# Patient Record
Sex: Male | Born: 1985 | Race: Black or African American | Hispanic: No | Marital: Single | Smoking: Current every day smoker
Health system: Southern US, Community
[De-identification: ages and names within clinical notes are randomized; demographics above are authoritative.]

---

## 2013-06-29 ENCOUNTER — Emergency Department (HOSPITAL_COMMUNITY)
Admission: EM | Admit: 2013-06-29 | Discharge: 2013-06-29 | Disposition: A | Discharge status: Home / Self Care | Payer: Self-pay | Attending: Emergency Medicine | Admitting: Emergency Medicine

## 2013-06-29 ENCOUNTER — Emergency Department (HOSPITAL_COMMUNITY): Payer: Self-pay

## 2013-06-29 ENCOUNTER — Encounter (HOSPITAL_COMMUNITY): Payer: Self-pay | Admitting: Emergency Medicine

## 2013-06-29 DIAGNOSIS — F17200 Nicotine dependence, unspecified, uncomplicated: Secondary | ICD-10-CM | POA: Insufficient documentation

## 2013-06-29 DIAGNOSIS — S0003XA Contusion of scalp, initial encounter: Secondary | ICD-10-CM | POA: Insufficient documentation

## 2013-06-29 DIAGNOSIS — Y929 Unspecified place or not applicable: Secondary | ICD-10-CM | POA: Insufficient documentation

## 2013-06-29 DIAGNOSIS — S0083XA Contusion of other part of head, initial encounter: Principal | ICD-10-CM

## 2013-06-29 DIAGNOSIS — W1789XA Other fall from one level to another, initial encounter: Secondary | ICD-10-CM | POA: Insufficient documentation

## 2013-06-29 DIAGNOSIS — Z23 Encounter for immunization: Secondary | ICD-10-CM | POA: Insufficient documentation

## 2013-06-29 DIAGNOSIS — S1093XA Contusion of unspecified part of neck, initial encounter: Principal | ICD-10-CM

## 2013-06-29 DIAGNOSIS — Y939 Activity, unspecified: Secondary | ICD-10-CM | POA: Insufficient documentation

## 2013-06-29 DIAGNOSIS — S0001XA Abrasion of scalp, initial encounter: Secondary | ICD-10-CM

## 2013-06-29 MED ORDER — TETANUS-DIPHTH-ACELL PERTUSSIS 5-2.5-18.5 LF-MCG/0.5 IM SUSP
0.5000 mL | Freq: Once | INTRAMUSCULAR | Status: AC
  Administered 2013-06-29: 0.5 mL via INTRAMUSCULAR
  Filled 2013-06-29: qty 0.5

## 2013-06-29 MED ORDER — OXYCODONE-ACETAMINOPHEN 5-325 MG PO TABS
1.0000 | ORAL_TABLET | ORAL | Status: AC | PRN
Start: 2013-06-29 — End: ?

## 2013-06-29 MED ORDER — OXYCODONE-ACETAMINOPHEN 5-325 MG PO TABS
1.0000 | ORAL_TABLET | Freq: Once | ORAL | Status: AC
  Administered 2013-06-29: 1 via ORAL
  Filled 2013-06-29: qty 1

## 2013-06-29 NOTE — Discharge Instructions (Signed)
Read the information below.  Use the prescribed medication as directed.  Please discuss all new medications with your pharmacist.  Do not take additional tylenol while taking the prescribed pain medication to avoid overdose.  You may return to the Emergency Department at any time for worsening condition or any new symptoms that concern you.    You have had a head injury which does not appear to require admission at this time. A concussion is a state of changed mental ability from trauma. SEEK IMMEDIATE MEDICAL ATTENTION IF: There is confusion or drowsiness (although children frequently become drowsy after injury).  You cannot awaken the injured person.  There is nausea (feeling sick to your stomach) or continued, forceful vomiting.  You notice dizziness or unsteadiness which is getting worse, or inability to walk.  You have convulsions or unconsciousness.  You experience severe, persistent headaches not relieved by Tylenol?. (Do not take aspirin as this impairs clotting abilities). Take other pain medications only as directed.  You cannot use arms or legs normally.  There are changes in pupil sizes. (This is the black center in the colored part of the eye)  There is clear or bloody discharge from the nose or ears.  Change in speech, vision, swallowing, or understanding.  Localized weakness, numbness, tingling, or change in bowel or bladder control.    Head Injury, Adult You have received a head injury. It does not appear serious at this time. Headaches and vomiting are common following head injury. It should be easy to awaken from sleeping. Sometimes it is necessary for you to stay in the emergency department for a while for observation. Sometimes admission to the hospital may be needed. After injuries such as yours, most problems occur within the first 24 hours, but side effects may occur up to 7 10 days after the injury. It is important for you to carefully monitor your condition and contact  your health care provider or seek immediate medical care if there is a change in your condition. WHAT ARE THE TYPES OF HEAD INJURIES? Head injuries can be as minor as a bump. Some head injuries can be more severe. More severe head injuries include:  A jarring injury to the brain (concussion).  A bruise of the brain (contusion). This mean there is bleeding in the brain that can cause swelling.  A cracked skull (skull fracture).  Bleeding in the brain that collects, clots, and forms a bump (hematoma). WHAT CAUSES A HEAD INJURY? A serious head injury is most likely to happen to someone who is in a car wreck and is not wearing a seat belt. Other causes of major head injuries include bicycle or motorcycle accidents, sports injuries, and falls. HOW ARE HEAD INJURIES DIAGNOSED? A complete history of the event leading to the injury and your current symptoms will be helpful in diagnosing head injuries. Many times, pictures of the brain, such as CT or MRI are needed to see the extent of the injury. Often, an overnight hospital stay is necessary for observation.  WHEN SHOULD I SEEK IMMEDIATE MEDICAL CARE?  You should get help right away if:  You have confusion or drowsiness.  You feel sick to your stomach (nauseous) or have continued, forceful vomiting.  You have dizziness or unsteadiness that is getting worse.  You have severe, continued headaches not relieved by medicine. Only take over-the-counter or prescription medicines for pain, fever, or discomfort as directed by your health care provider.  You do not have normal function of the  arms or legs or are unable to walk.  You notice changes in the black spots in the center of the colored part of your eye (pupil).  You have a clear or bloody fluid coming from your nose or ears.  You have a loss of vision. During the next 24 hours after the injury, you must stay with someone who can watch you for the warning signs. This person should contact  local emergency services (911 in the [place] if you have seizures, you become unconscious, or you are unable to wake up. HOW CAN I PREVENT A HEAD INJURY IN THE FUTURE? The most important factor for preventing major head injuries is avoiding motor vehicle accidents. To minimize the potential for damage to your head, it is crucial to wear seat belts while riding in motor vehicles. Wearing helmets while bike riding and playing collision sports (like football) is also helpful. Also, avoiding dangerous activities around the house will further help reduce your risk of head injury.  WHEN CAN I RETURN TO NORMAL ACTIVITIES AND ATHLETICS? You should be reevaluated by your health care provider before returning to these activities. If you have any of the following symptoms, you should not return to activities or contact sports until 1 week after the symptoms have stopped:  Persistent headache.  Dizziness or vertigo.  Poor attention and concentration.  Confusion.  Memory problems.  Nausea or vomiting.  Fatigue or tire easily.  Irritability.  Intolerant of bright lights or loud noises.  Anxiety or depression.  Disturbed sleep. MAKE SURE YOU:   Understand these instructions.  Will watch your condition.  Will get help right away if you are not doing well or get worse. Document Released: 03/27/2005 Document Revised: 01/15/2013 Document Reviewed: 12/02/2012 [facility] Patient Information 2014 [place], [place].  Facial or Scalp Contusion A facial or scalp contusion is a deep bruise on the face or head. Injuries to the face and head generally cause a lot of swelling, especially around the eyes. Contusions are the result of an injury that caused bleeding under the skin. The contusion may turn blue, purple, or yellow. Minor injuries will give you a painless contusion, but more severe contusions may stay painful and swollen for a few weeks.  CAUSES  A facial or scalp contusion is caused by a blunt  injury or trauma to the face or head area.  SIGNS AND SYMPTOMS   Swelling of the injured area.   Discoloration of the injured area.   Tenderness, soreness, or pain in the injured area.  DIAGNOSIS  The diagnosis can be made by taking a medical history and doing a physical exam. An X-ray exam, CT scan, or MRI may be needed to determine if there are any associated injuries, such as broken bones (fractures). TREATMENT  Often, the best treatment for a facial or scalp contusion is applying cold compresses to the injured area. Over-the-counter medicines may also be recommended for pain control.  HOME CARE INSTRUCTIONS   Only take over-the-counter or prescription medicines as directed by your health care provider.   Apply ice to the injured area.   Put ice in a plastic bag.   Place a towel between your skin and the bag.   Leave the ice on for 20 minutes, 2 3 times a day.  SEEK MEDICAL CARE IF:  You have bite problems.   You have pain with chewing.   You are concerned about facial defects. SEEK IMMEDIATE MEDICAL CARE IF:  You have severe pain or a  headache that is not relieved by medicine.   You have unusual sleepiness, confusion, or personality changes.   You throw up (vomit).   You have a persistent nosebleed.   You have double vision or blurred vision.   You have fluid drainage from your nose or ear.   You have difficulty walking or using your arms or legs.  MAKE SURE YOU:   Understand these instructions.  Will watch your condition.  Will get help right away if you are not doing well or get worse. Document Released: 05/04/2004 Document Revised: 01/15/2013 Document Reviewed: 11/07/2012 [facility] Patient Information 2014 [place].  Hematoma A hematoma is a collection of blood under the skin, in an organ, in a body space, in a joint space, or in other tissue. The blood can clot to form a lump that you can see and feel. The lump is often firm and  may sometimes become sore and tender. Most hematomas get better in a few days to weeks. However, some hematomas may be serious and require medical care. Hematomas can range in size from very small to very large. CAUSES  A hematoma can be caused by a blunt or penetrating injury. It can also be caused by spontaneous leakage from a blood vessel under the skin. Spontaneous leakage from a blood vessel is more likely to occur in older people, especially those taking blood thinners. Sometimes, a hematoma can develop after certain medical procedures. SIGNS AND SYMPTOMS   A firm lump on the body.  Possible pain and tenderness in the area.  Bruising.Blue, dark blue, purple-red, or yellowish skin may appear at the site of the hematoma if the hematoma is close to the surface of the skin. For hematomas in deeper tissues or body spaces, the signs and symptoms may be subtle. For example, an intra-abdominal hematoma may cause abdominal pain, weakness, fainting, and shortness of breath. An intracranial hematoma may cause a headache or symptoms such as weakness, trouble speaking, or a change in consciousness. DIAGNOSIS  A hematoma can usually be diagnosed based on your medical history and a physical exam. Imaging tests may be needed if your health care provider suspects a hematoma in deeper tissues or body spaces, such as the abdomen, head, or chest. These tests may include ultrasonography or a CT scan.  TREATMENT  Hematomas usually go away on their own over time. Rarely does the blood need to be drained out of the body. Large hematomas or those that may affect vital organs will sometimes need surgical drainage or monitoring. HOME CARE INSTRUCTIONS   Apply ice to the injured area:   Put ice in a plastic bag.   Place a towel between your skin and the bag.   Leave the ice on for 20 minutes, 2 3 times a day for the first 1 to 2 days.   After the first 2 days, switch to using warm compresses on the hematoma.    Elevate the injured area to help decrease pain and swelling. Wrapping the area with an elastic bandage may also be helpful. Compression helps to reduce swelling and promotes shrinking of the hematoma. Make sure the bandage is not wrapped too tight.   If your hematoma is on a lower extremity and is painful, crutches may be helpful for a couple days.   Only take over-the-counter or prescription medicines as directed by your health care provider. SEEK IMMEDIATE MEDICAL CARE IF:   You have increasing pain, or your pain is not controlled with medicine.  You have a fever.   You have worsening swelling or discoloration.   Your skin over the hematoma breaks or starts bleeding.   Your hematoma is in your chest or abdomen and you have weakness, shortness of breath, or a change in consciousness.  Your hematoma is on your scalp (caused by a fall or injury) and you have a worsening headache or a change in alertness or consciousness. MAKE SURE YOU:   Understand these instructions.  Will watch your condition.  Will get help right away if you are not doing well or get worse. Document Released: 11/09/2003 Document Revised: 11/27/2012 Document Reviewed: 09/04/2012 [facility] Patient Information 2014 [place], [place].

## 2013-06-29 NOTE — ED Provider Notes (Signed)
Medical screening examination/treatment/procedure(s) were conducted as a shared visit with non-physician practitioner(s) and myself.  I personally evaluated the patient during the encounter.   EKG Interpretation None      Well appearing. Dc home. Plain films and CT negative.   1. Injury resulting from fall from height   2. Scalp hematoma   3. Scalp abrasion    Dg Chest 2 View  06/29/2013   CLINICAL DATA:  Fall from 15 feet.  No chest complaints.  EXAM: CHEST  2 VIEW  COMPARISON:  None.  FINDINGS: The heart size and mediastinal contours are within normal limits. Both lungs are clear. The visualized skeletal structures are unremarkable.  IMPRESSION: No active cardiopulmonary disease.   Electronically Signed   By: Elige KoHetal  Patel   On: 06/29/2013 15:42   Dg Pelvis 1-2 Views  06/29/2013   CLINICAL DATA:  Fall  EXAM: PELVIS - 1-2 VIEW  COMPARISON:  None.  FINDINGS: There is no evidence of pelvic fracture or diastasis. No other pelvic bone lesions are seen.  IMPRESSION: Negative.   Electronically Signed   By: Elige KoHetal  Patel   On: 06/29/2013 15:42   Ct Head Wo Contrast  06/29/2013   CLINICAL DATA:  Injury  EXAM: CT HEAD WITHOUT CONTRAST  CT CERVICAL SPINE WITHOUT CONTRAST  TECHNIQUE: Multidetector CT imaging of the head and cervical spine was performed following the standard protocol without intravenous contrast. Multiplanar CT image reconstructions of the cervical spine were also generated.  COMPARISON:  None.  FINDINGS: CT HEAD FINDINGS  No mass effect, midline shift, or acute intracranial hemorrhage. Brain parenchyma and ventricular system are grossly within normal limits. Soft tissue hemorrhage over the right parietal region is noted. Mastoid air cells are clear. Cranium is intact.  CT CERVICAL SPINE FINDINGS  No acute fracture. No dislocation. No obvious soft tissue injury. No spinal hematoma.  8 mm hypodensity in the right thyroid gland. Lung apices are unremarkable.  IMPRESSION: No acute intracranial  pathology.  No acute injury in the cervical spine.  8 mm right thyroid hypodensity. A outpatient ultrasound is recommended.   Electronically Signed   By: Maryclare BeanArt  Hoss M.D.   On: 06/29/2013 13:10   Ct Cervical Spine Wo Contrast  06/29/2013   CLINICAL DATA:  Injury  EXAM: CT HEAD WITHOUT CONTRAST  CT CERVICAL SPINE WITHOUT CONTRAST  TECHNIQUE: Multidetector CT imaging of the head and cervical spine was performed following the standard protocol without intravenous contrast. Multiplanar CT image reconstructions of the cervical spine were also generated.  COMPARISON:  None.  FINDINGS: CT HEAD FINDINGS  No mass effect, midline shift, or acute intracranial hemorrhage. Brain parenchyma and ventricular system are grossly within normal limits. Soft tissue hemorrhage over the right parietal region is noted. Mastoid air cells are clear. Cranium is intact.  CT CERVICAL SPINE FINDINGS  No acute fracture. No dislocation. No obvious soft tissue injury. No spinal hematoma.  8 mm hypodensity in the right thyroid gland. Lung apices are unremarkable.  IMPRESSION: No acute intracranial pathology.  No acute injury in the cervical spine.  8 mm right thyroid hypodensity. A outpatient ultrasound is recommended.   Electronically Signed   By: Maryclare BeanArt  Hoss M.D.   On: 06/29/2013 13:10  I personally reviewed the imaging tests through PACS system I reviewed available ER/hospitalization records through the EMR  Pt infomed of need for thyroid US as outpatient  Lyanne CoKevin M Marshall Kampf, MD 06/29/13 1559
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
## 2013-06-29 NOTE — ED Notes (Signed)
Wound care done to the back of pts head. Small abrasion noted.
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
## 2015-01-28 IMAGING — CT CT CERVICAL SPINE W/O CM
4 of 6 series · 13 of 33 positions shown, 15 images · non-contrast
Comparison: None.

CLINICAL DATA: Injury

EXAM:
CT HEAD WITHOUT CONTRAST
CT CERVICAL SPINE WITHOUT CONTRAST
TECHNIQUE: Multidetector CT imaging of the head and cervical spine was
performed following the standard protocol without intravenous
contrast. Multiplanar CT image reconstructions of the cervical spine
were also generated.

[Series 5: soft tissue · axial · 0.33mm/px · z∈[+101,+197]mm · 3 of 98 slices shown]
[im 25/98  soft-tissue]
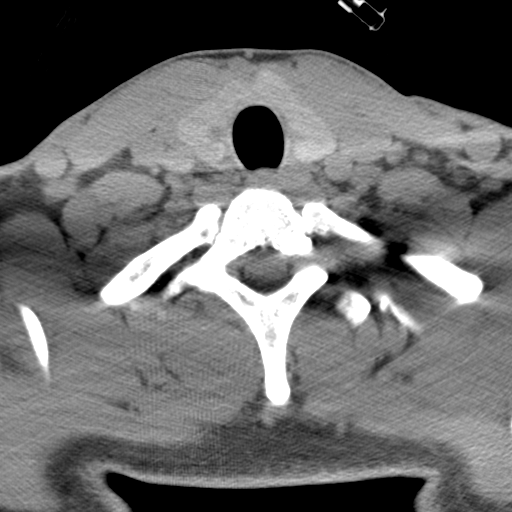
[im 49/98  soft-tissue]
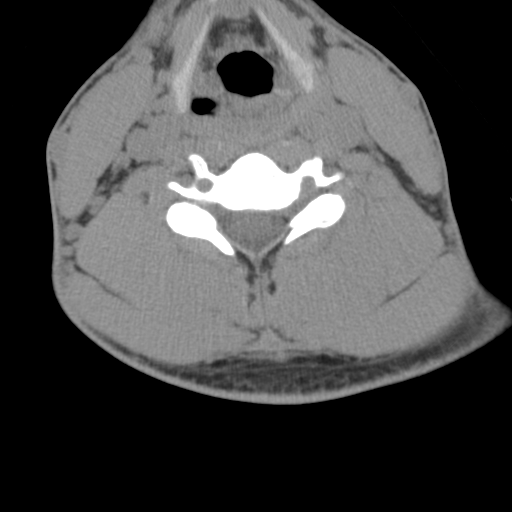
[im 73/98  soft-tissue]
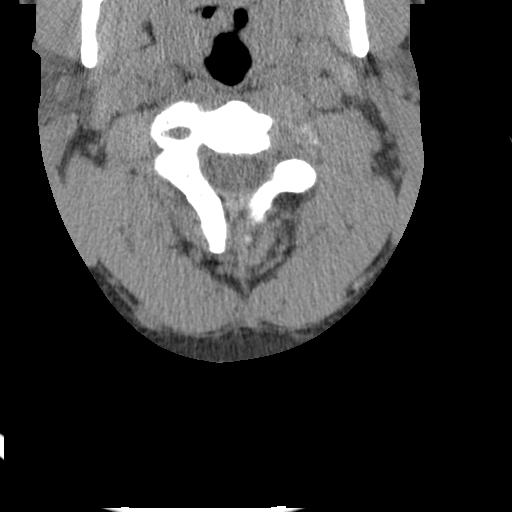

[orthog · axial · 0.33mm/px · z∈[+97,+155]mm · 2 of 91 slices shown, 3 images]
[im 31/91  soft-tissue]
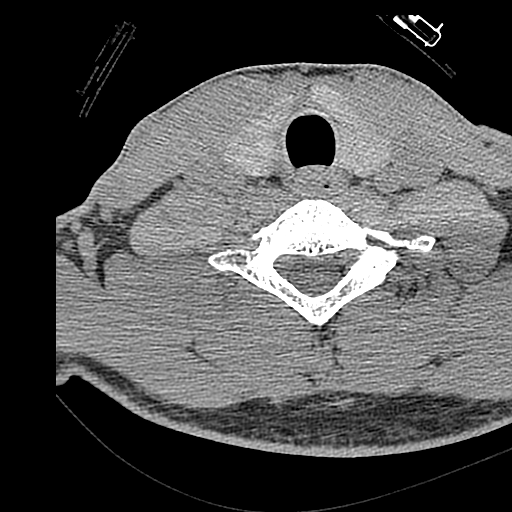
[im 31/91  bone]
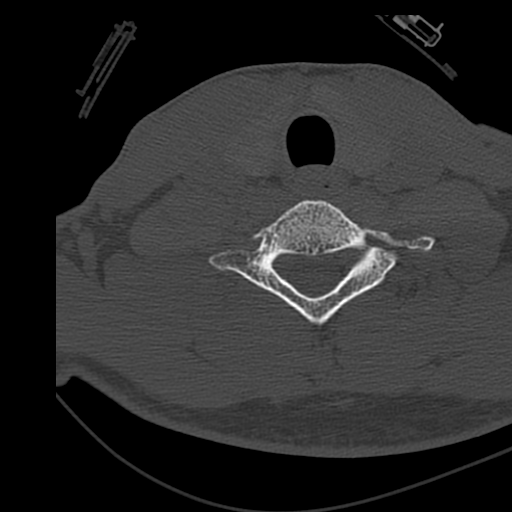
[im 61/91  bone]
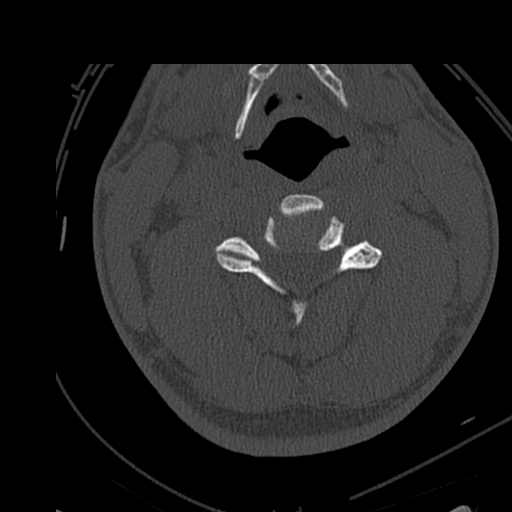

[mpr, sagittal, sagittal · sagittal · 0.38mm/px · 5 of 43 slices shown, 6 images]
[im 15/43  bone]
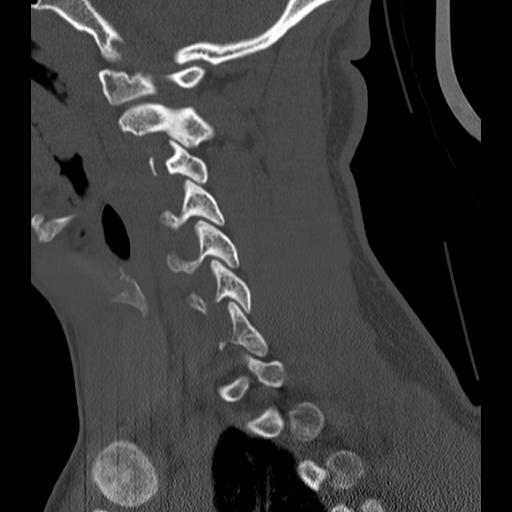
[im 18/43  bone]
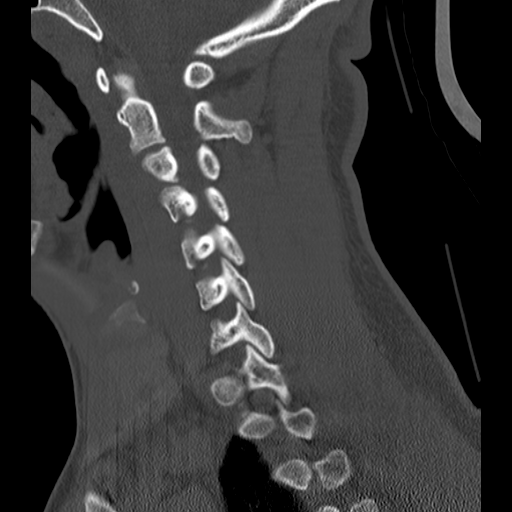
[im 22/43  soft-tissue]
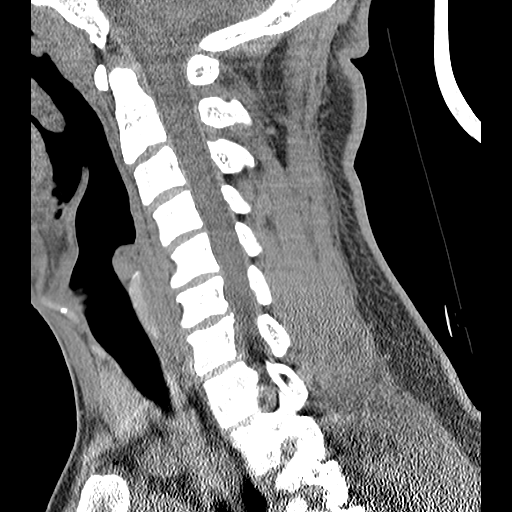
[im 22/43  bone]
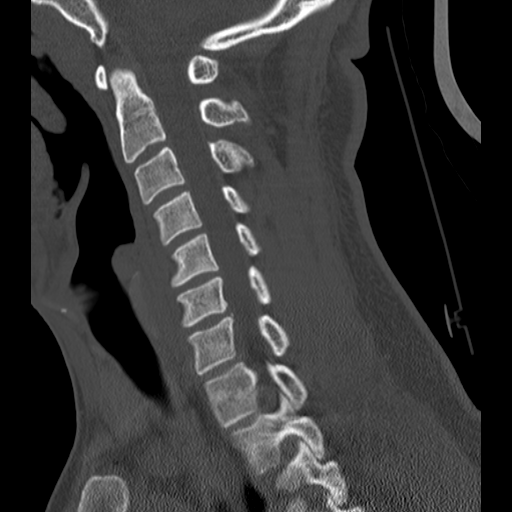
[im 25/43  bone]
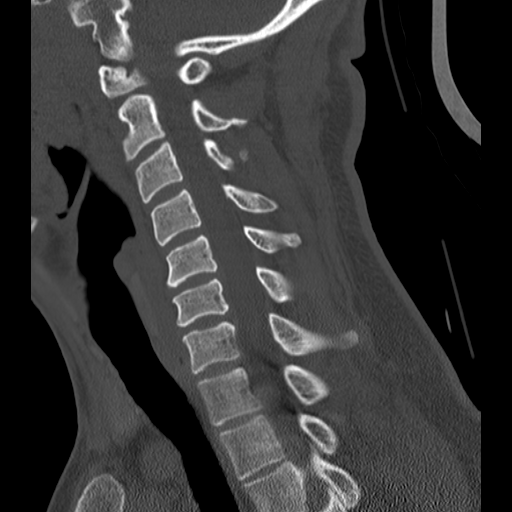
[im 29/43  bone]
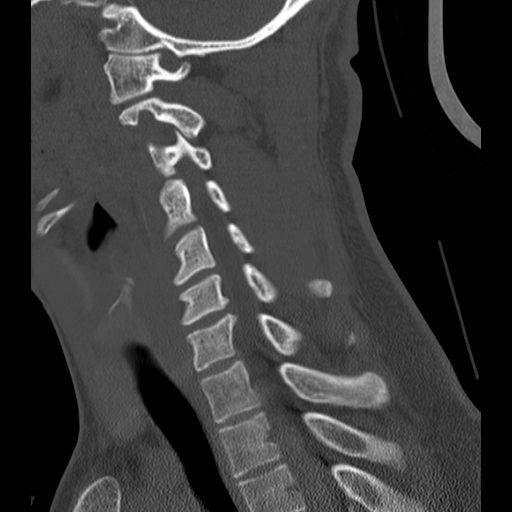

[coronal · coronal · 0.38mm/px · 3 of 38 slices shown]
[im 8/38  bone]
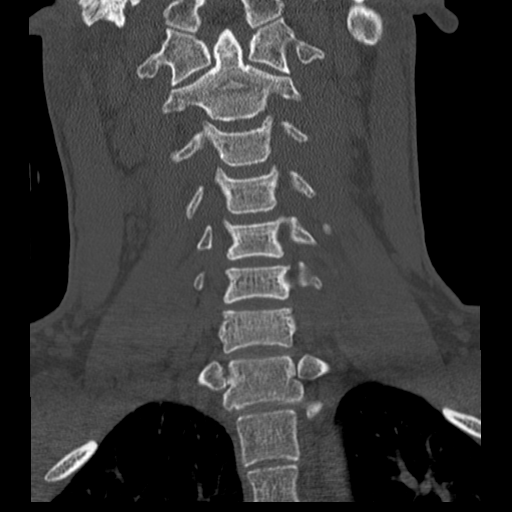
[im 15/38  bone]
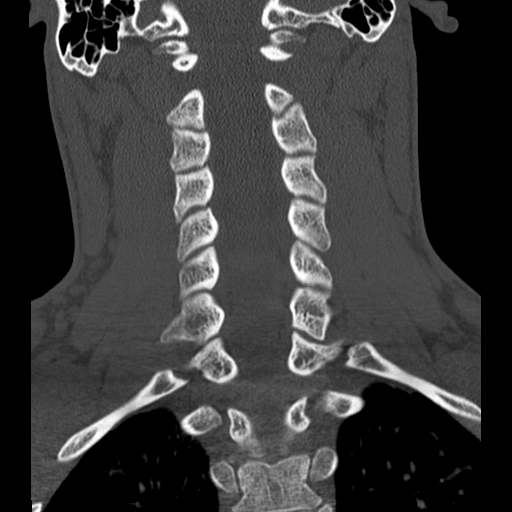
[im 23/38  bone]
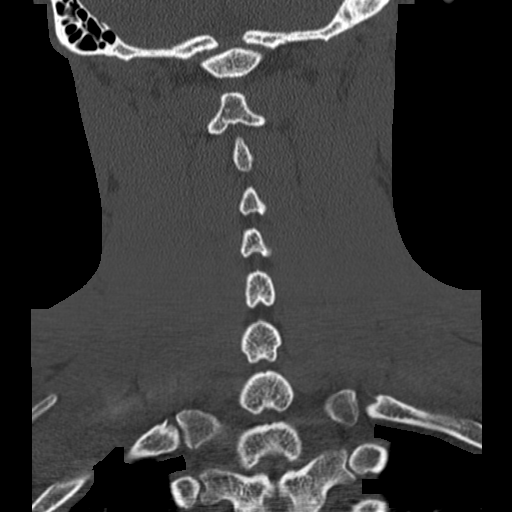

[13 of 33 positions shown; findings below may reference images not displayed]

FINDINGS: CT HEAD FINDINGS

No mass effect, midline shift, or acute intracranial hemorrhage.
Brain parenchyma and ventricular system are grossly within normal
limits. Soft tissue hemorrhage over the right parietal region is
noted. Mastoid air cells are clear. Cranium is intact.

CT CERVICAL SPINE FINDINGS

No acute fracture. No dislocation. No obvious soft tissue injury. No
spinal hematoma.

8 mm hypodensity in the right thyroid gland. Lung apices are
unremarkable.
IMPRESSION: No acute intracranial pathology.

No acute injury in the cervical spine.

8 mm right thyroid hypodensity. A outpatient ultrasound is
recommended.
